# Patient Record
Sex: Male | Born: 1995 | Race: White | Hispanic: No | Marital: Single | Smoking: Never smoker
Health system: Southern US, Community
[De-identification: ages and names within clinical notes are randomized; demographics above are authoritative.]

## PROBLEM LIST (undated history)

## (undated) DIAGNOSIS — F32A Depression, unspecified: Secondary | ICD-10-CM

## (undated) DIAGNOSIS — J45909 Unspecified asthma, uncomplicated: Secondary | ICD-10-CM

## (undated) DIAGNOSIS — F329 Major depressive disorder, single episode, unspecified: Secondary | ICD-10-CM

## (undated) DIAGNOSIS — R569 Unspecified convulsions: Secondary | ICD-10-CM

---
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
## 2023-05-24 DIAGNOSIS — Z419 Encounter for procedure for purposes other than remedying health state, unspecified: Secondary | ICD-10-CM | POA: Diagnosis not present

## 2023-06-08 DIAGNOSIS — F331 Major depressive disorder, recurrent, moderate: Secondary | ICD-10-CM | POA: Diagnosis not present

## 2023-06-08 DIAGNOSIS — F411 Generalized anxiety disorder: Secondary | ICD-10-CM | POA: Diagnosis not present

## 2023-06-08 DIAGNOSIS — F900 Attention-deficit hyperactivity disorder, predominantly inattentive type: Secondary | ICD-10-CM | POA: Diagnosis not present

## 2023-06-15 DIAGNOSIS — F900 Attention-deficit hyperactivity disorder, predominantly inattentive type: Secondary | ICD-10-CM | POA: Diagnosis not present

## 2023-06-22 DIAGNOSIS — F900 Attention-deficit hyperactivity disorder, predominantly inattentive type: Secondary | ICD-10-CM | POA: Diagnosis not present

## 2023-06-22 DIAGNOSIS — F331 Major depressive disorder, recurrent, moderate: Secondary | ICD-10-CM | POA: Diagnosis not present

## 2023-06-22 DIAGNOSIS — F411 Generalized anxiety disorder: Secondary | ICD-10-CM | POA: Diagnosis not present

## 2023-06-23 DIAGNOSIS — Z419 Encounter for procedure for purposes other than remedying health state, unspecified: Secondary | ICD-10-CM | POA: Diagnosis not present

## 2023-07-20 DIAGNOSIS — F900 Attention-deficit hyperactivity disorder, predominantly inattentive type: Secondary | ICD-10-CM | POA: Diagnosis not present

## 2023-07-20 DIAGNOSIS — F331 Major depressive disorder, recurrent, moderate: Secondary | ICD-10-CM | POA: Diagnosis not present

## 2023-07-20 DIAGNOSIS — F411 Generalized anxiety disorder: Secondary | ICD-10-CM | POA: Diagnosis not present

## 2023-07-24 DIAGNOSIS — Z419 Encounter for procedure for purposes other than remedying health state, unspecified: Secondary | ICD-10-CM | POA: Diagnosis not present

## 2023-08-21 DIAGNOSIS — F331 Major depressive disorder, recurrent, moderate: Secondary | ICD-10-CM | POA: Diagnosis not present

## 2023-08-21 DIAGNOSIS — F411 Generalized anxiety disorder: Secondary | ICD-10-CM | POA: Diagnosis not present

## 2023-08-21 DIAGNOSIS — F900 Attention-deficit hyperactivity disorder, predominantly inattentive type: Secondary | ICD-10-CM | POA: Diagnosis not present

## 2023-08-23 DIAGNOSIS — Z419 Encounter for procedure for purposes other than remedying health state, unspecified: Secondary | ICD-10-CM | POA: Diagnosis not present

## 2023-08-25 DIAGNOSIS — F331 Major depressive disorder, recurrent, moderate: Secondary | ICD-10-CM | POA: Diagnosis not present

## 2023-08-25 DIAGNOSIS — F411 Generalized anxiety disorder: Secondary | ICD-10-CM | POA: Diagnosis not present

## 2023-08-25 DIAGNOSIS — F900 Attention-deficit hyperactivity disorder, predominantly inattentive type: Secondary | ICD-10-CM | POA: Diagnosis not present

## 2023-09-23 DIAGNOSIS — Z419 Encounter for procedure for purposes other than remedying health state, unspecified: Secondary | ICD-10-CM | POA: Diagnosis not present

## 2023-10-24 DIAGNOSIS — Z419 Encounter for procedure for purposes other than remedying health state, unspecified: Secondary | ICD-10-CM | POA: Diagnosis not present

## 2023-11-21 DIAGNOSIS — Z419 Encounter for procedure for purposes other than remedying health state, unspecified: Secondary | ICD-10-CM | POA: Diagnosis not present

## 2024-01-02 DIAGNOSIS — Z419 Encounter for procedure for purposes other than remedying health state, unspecified: Secondary | ICD-10-CM | POA: Diagnosis not present

## 2024-02-01 DIAGNOSIS — Z419 Encounter for procedure for purposes other than remedying health state, unspecified: Secondary | ICD-10-CM | POA: Diagnosis not present

## 2024-02-21 ENCOUNTER — Emergency Department (HOSPITAL_BASED_OUTPATIENT_CLINIC_OR_DEPARTMENT_OTHER)
Admission: EM | Admit: 2024-02-21 | Discharge: 2024-02-21 | Disposition: A | Discharge status: Home / Self Care | Attending: Emergency Medicine | Admitting: Emergency Medicine

## 2024-02-21 ENCOUNTER — Emergency Department (HOSPITAL_BASED_OUTPATIENT_CLINIC_OR_DEPARTMENT_OTHER)

## 2024-02-21 ENCOUNTER — Encounter (HOSPITAL_BASED_OUTPATIENT_CLINIC_OR_DEPARTMENT_OTHER): Payer: Self-pay

## 2024-02-21 ENCOUNTER — Other Ambulatory Visit: Payer: Self-pay

## 2024-02-21 DIAGNOSIS — D72829 Elevated white blood cell count, unspecified: Secondary | ICD-10-CM | POA: Insufficient documentation

## 2024-02-21 DIAGNOSIS — R1031 Right lower quadrant pain: Secondary | ICD-10-CM | POA: Diagnosis not present

## 2024-02-21 LAB — URINALYSIS, MICROSCOPIC (REFLEX)
RBC / HPF: NONE SEEN RBC/hpf (ref 0–5)
Squamous Epithelial / HPF: NONE SEEN /HPF (ref 0–5)
WBC, UA: NONE SEEN WBC/hpf (ref 0–5)

## 2024-02-21 LAB — COMPREHENSIVE METABOLIC PANEL WITH GFR
ALT: 16 U/L (ref 0–44)
AST: 23 U/L (ref 15–41)
Albumin: 4.6 g/dL (ref 3.5–5.0)
Alkaline Phosphatase: 66 U/L (ref 38–126)
Anion gap: 12 (ref 5–15)
BUN: 21 mg/dL — ABNORMAL HIGH (ref 6–20)
CO2: 25 mmol/L (ref 22–32)
Calcium: 9.6 mg/dL (ref 8.9–10.3)
Chloride: 105 mmol/L (ref 98–111)
Creatinine, Ser: 1.13 mg/dL (ref 0.61–1.24)
GFR, Estimated: >60 mL/min (ref >60)
Glucose, Bld: 92 mg/dL (ref 70–99)
Potassium: 4.1 mmol/L (ref 3.5–5.1)
Sodium: 141 mmol/L (ref 135–145)
Total Bilirubin: 1.3 mg/dL — ABNORMAL HIGH (ref 0.0–1.2)
Total Protein: 7.7 g/dL (ref 6.5–8.1)

## 2024-02-21 LAB — URINALYSIS, ROUTINE W REFLEX MICROSCOPIC
Bilirubin Urine: NEGATIVE
Glucose, UA: NEGATIVE mg/dL
Hgb urine dipstick: NEGATIVE
Ketones, ur: NEGATIVE mg/dL
Leukocytes,Ua: NEGATIVE
Nitrite: NEGATIVE
Protein, ur: 30 mg/dL — AB
Specific Gravity, Urine: 1.025 (ref 1.005–1.030)
pH: 6.5 (ref 5.0–8.0)

## 2024-02-21 LAB — CBC WITH DIFFERENTIAL/PLATELET
Abs Immature Granulocytes: 0.05 10*3/uL (ref 0.00–0.07)
Basophils Absolute: 0 10*3/uL (ref 0.0–0.1)
Basophils Relative: 0 %
Eosinophils Absolute: 0.1 10*3/uL (ref 0.0–0.5)
Eosinophils Relative: 1 %
HCT: 47.5 % (ref 39.0–52.0)
Hemoglobin: 16.7 g/dL (ref 13.0–17.0)
Immature Granulocytes: 1 %
Lymphocytes Relative: 13 %
Lymphs Abs: 1.5 10*3/uL (ref 0.7–4.0)
MCH: 29.5 pg (ref 26.0–34.0)
MCHC: 35.2 g/dL (ref 30.0–36.0)
MCV: 83.9 fL (ref 80.0–100.0)
Monocytes Absolute: 0.7 10*3/uL (ref 0.1–1.0)
Monocytes Relative: 6 %
Neutro Abs: 8.8 10*3/uL — ABNORMAL HIGH (ref 1.7–7.7)
Neutrophils Relative %: 79 %
Platelets: 267 10*3/uL (ref 150–400)
RBC: 5.66 MIL/uL (ref 4.22–5.81)
RDW: 11.6 % (ref 11.5–15.5)
WBC: 11.1 10*3/uL — ABNORMAL HIGH (ref 4.0–10.5)
nRBC: 0 % (ref 0.0–0.2)

## 2024-02-21 LAB — LIPASE, BLOOD: Lipase: 21 U/L (ref 11–51)

## 2024-02-21 MED ORDER — IOHEXOL 300 MG/ML  SOLN
100.0000 mL | Freq: Once | INTRAMUSCULAR | Status: AC | PRN
  Administered 2024-02-21: 100 mL via INTRAVENOUS

## 2024-02-21 MED ORDER — ONDANSETRON HCL 4 MG/2ML IJ SOLN
4.0000 mg | Freq: Once | INTRAMUSCULAR | Status: AC
  Administered 2024-02-21: 4 mg via INTRAVENOUS
  Filled 2024-02-21: qty 2

## 2024-02-21 MED ORDER — SODIUM CHLORIDE 0.9 % IV BOLUS
1000.0000 mL | Freq: Once | INTRAVENOUS | Status: AC
  Administered 2024-02-21: 1000 mL via INTRAVENOUS

## 2024-02-21 MED ORDER — SODIUM CHLORIDE 0.9 % IV SOLN: INTRAVENOUS | Status: DC | PRN

## 2024-02-21 MED ORDER — MORPHINE SULFATE (PF) 4 MG/ML IV SOLN
4.0000 mg | Freq: Once | INTRAVENOUS | Status: AC
  Administered 2024-02-21: 4 mg via INTRAVENOUS
  Filled 2024-02-21: qty 1

## 2024-02-21 MED ORDER — METRONIDAZOLE 500 MG PO TABS
2000.0000 mg | ORAL_TABLET | Freq: Once | ORAL | Status: AC
  Administered 2024-02-21: 2000 mg via ORAL
  Filled 2024-02-21: qty 4

## 2024-02-21 MED ORDER — SODIUM CHLORIDE 0.9 % IV SOLN
1.0000 g | Freq: Once | INTRAVENOUS | Status: AC
  Administered 2024-02-21: 1 g via INTRAVENOUS
  Filled 2024-02-21: qty 10

## 2024-02-21 MED ORDER — AZITHROMYCIN 250 MG PO TABS
1000.0000 mg | ORAL_TABLET | Freq: Once | ORAL | Status: AC
  Administered 2024-02-21: 1000 mg via ORAL
  Filled 2024-02-21: qty 4

## 2024-02-21 NOTE — ED Notes (Signed)
 ED Provider at bedside.

## 2024-02-21 NOTE — Discharge Instructions (Signed)
 Your CT scan looked okay.  But does not mean that nothing is wrong with you.  Please return for worsening pain fever or inability to eat or drink.  Your urine had bacteria in it.  This could be due to lab error but I did send it off to test for sexually transmitted diseases.  I also treated you with antibiotics here for the same.  If you do come back positive you should let your partners know up to 90 days before today so they can be tested and treated.  Take 4 over the counter ibuprofen tablets 3 times a day or 2 over-the-counter naproxen tablets twice a day for pain. Also take tylenol  1000mg (2 extra strength) four times a day.

## 2024-02-21 NOTE — ED Notes (Signed)
 Patient transported to CT

## 2024-02-21 NOTE — ED Provider Notes (Signed)
 [facility] Provider Note   CSN: [id number] Arrival date & time: 02/21/24  0700     History  Chief Complaint  Patient presents with   Abdominal Pain    Eric Ritter is a 28 y.o. male.  28 yo M with a chief complaints of right lower quadrant abdominal discomfort.  Going on for about 3 days now.  First couple days had nausea and vomiting but this seems to have resolved with the pain has persisted.  No fevers no urinary symptoms.  Sometimes does feel he radiates to his testicles.  Is a bit worse with ambulation.   Abdominal Pain      Home Medications Prior to Admission medications   Medication Sig Start Date End Date Taking? Authorizing Provider  ARIPiprazole  (ABILIFY ) 10 MG tablet Take one-half tablet (total 5 mg) every morning and 1 tablet (total 10 mg) every bedtime 09/19/14   Elesa Grills, MD  cholecalciferol  (VITAMIN D ) 1000 UNITS tablet Take 1 tablet (1,000 Units total) by mouth daily. 09/19/14   Elesa Grills, MD  citalopram  (CELEXA ) 10 MG tablet Take 3 tablets (30 mg total) by mouth at bedtime. 09/19/14   Elesa Grills, MD  clindamycin  (CLINDAGEL ) 1 % gel Apply topically 2 (two) times daily. 09/19/14   Elesa Grills, MD  methylphenidate  36 MG PO CR tablet Take 1 tablet (36 mg total) by mouth daily. 09/19/14   Elesa Grills, MD  traZODone  (DESYREL ) 50 MG tablet Take 1 tablet (50 mg total) by mouth at bedtime. 09/19/14   Elesa Grills, MD      Allergies    Patient has no known allergies.    Review of Systems   Review of Systems  Gastrointestinal:  Positive for abdominal pain.    Physical Exam Updated Vital Signs BP (!) 136/95   Pulse 95   Temp 98.5 F (36.9 C) (Oral)   Resp 20   Ht 5\' 9"  (1.753 m)   Wt 81.6 kg   SpO2 100%   BMI 26.58 kg/m  Physical Exam Vitals and nursing note reviewed.  Constitutional:      Appearance: He is well-developed.  HENT:     Head: Normocephalic and  atraumatic.  Eyes:     Pupils: Pupils are equal, round, and reactive to light.  Neck:     Vascular: No JVD.  Cardiovascular:     Rate and Rhythm: Normal rate and regular rhythm.     Heart sounds: No murmur heard.    No friction rub. No gallop.  Pulmonary:     Effort: No respiratory distress.     Breath sounds: No wheezing.  Abdominal:     General: There is no distension.     Tenderness: There is abdominal tenderness in the right lower quadrant. There is no guarding or rebound.     Comments: Patient has most tenderness closer to the ASIS than McBurney's point.  No guarding no rebound.  Negative Rovsing's.  Genitourinary:    Comments: Cremasteric reflex intact bilaterally.  No obvious testicular tenderness.  No hernias or masses no lymphadenopathy Musculoskeletal:        General: Normal range of motion.     Cervical back: Normal range of motion and neck supple.  Skin:    Coloration: Skin is not pale.     Findings: No rash.  Neurological:     Mental Status: He is alert and oriented to person, place, and time.  Psychiatric:  Behavior: Behavior normal.     ED Results / Procedures / Treatments   Labs (all labs ordered are listed, but only abnormal results are displayed) Labs Reviewed  CBC WITH DIFFERENTIAL/PLATELET - Abnormal; Notable for the following components:      Result Value   WBC 11.1 (*)    Neutro Abs 8.8 (*)    All other components within normal limits  COMPREHENSIVE METABOLIC PANEL WITH GFR - Abnormal; Notable for the following components:   BUN 21 (*)    Total Bilirubin 1.3 (*)    All other components within normal limits  URINALYSIS, ROUTINE W REFLEX MICROSCOPIC - Abnormal; Notable for the following components:   Protein, ur 30 (*)    All other components within normal limits  URINALYSIS, MICROSCOPIC (REFLEX) - Abnormal; Notable for the following components:   Bacteria, UA MANY (*)    All other components within normal limits  LIPASE, BLOOD   GC/CHLAMYDIA PROBE AMP ([place]) NOT AT [facility]    EKG None  Radiology CT ABDOMEN PELVIS W CONTRAST Result Date: 02/21/2024 CLINICAL DATA:  Right lower quadrant abdominal pain. EXAM: CT ABDOMEN AND PELVIS WITH CONTRAST TECHNIQUE: Multidetector CT imaging of the abdomen and pelvis was performed using the standard protocol following bolus administration of intravenous contrast. RADIATION DOSE REDUCTION: This exam was performed according to the departmental dose-optimization program which includes automated exposure control, adjustment of the mA and/or kV according to patient size and/or use of iterative reconstruction technique. CONTRAST:  100mL OMNIPAQUE IOHEXOL 300 MG/ML  SOLN COMPARISON:  None Available. FINDINGS: Lower chest: No acute findings. Hepatobiliary: No suspicious focal abnormality within the liver parenchyma. There is no evidence for gallstones, gallbladder wall thickening, or pericholecystic fluid. No intrahepatic or extrahepatic biliary dilation. Pancreas: No focal mass lesion. No dilatation of the main duct. No intraparenchymal cyst. No peripancreatic edema. Spleen: No splenomegaly. No suspicious focal mass lesion. Adrenals/Urinary Tract: No adrenal nodule or mass. Kidneys unremarkable. No evidence for hydroureter. The urinary bladder appears normal for the degree of distention. Stomach/Bowel: Stomach is unremarkable. No gastric wall thickening. No evidence of outlet obstruction. Duodenum is normally positioned as is the ligament of Treitz. No small bowel wall thickening. No small bowel dilatation. The terminal ileum is normal. High density No gross colonic mass. No colonic wall thickening. material in the lumen of the appendix compatible with the presence of appendicoliths, but appendix otherwise normal in appearance. Vascular/Lymphatic: No abdominal aortic aneurysm. No abdominal aortic atherosclerotic calcification. There is no gastrohepatic or hepatoduodenal ligament lymphadenopathy. No  retroperitoneal or mesenteric lymphadenopathy. No pelvic sidewall lymphadenopathy. Reproductive: The prostate gland and seminal vesicles are unremarkable. Other: No intraperitoneal free fluid. Musculoskeletal: No worrisome lytic or sclerotic osseous abnormality. IMPRESSION: 1. No acute findings in the abdomen or pelvis. Specifically, no findings to explain the patient's history of right lower quadrant pain. 2. Appendicoliths in the lumen of the appendix, but appendix otherwise normal in appearance. Electronically Signed   By: Donnal Fusi M.D.   On: 02/21/2024 09:19    Procedures Procedures    Medications Ordered in ED Medications  0.9 %  sodium chloride infusion (0 mLs Intravenous Stopped 02/21/24 1126)  sodium chloride 0.9 % bolus 1,000 mL (0 mLs Intravenous Stopped 02/21/24 0852)  morphine (PF) 4 MG/ML injection 4 mg (4 mg Intravenous Given 02/21/24 0810)  ondansetron (ZOFRAN) injection 4 mg (4 mg Intravenous Given 02/21/24 0810)  iohexol (OMNIPAQUE) 300 MG/ML solution 100 mL (100 mLs Intravenous Contrast Given 02/21/24 0901)  cefTRIAXone (ROCEPHIN) 1  g in sodium chloride 0.9 % 100 mL IVPB (0 g Intravenous Stopped 02/21/24 1126)  azithromycin (ZITHROMAX) tablet 1,000 mg (1,000 mg Oral Given 02/21/24 1050)  metroNIDAZOLE (FLAGYL) tablet 2,000 mg (2,000 mg Oral Given 02/21/24 1050)    ED Course/ Medical Decision Making/ A&P                                 Medical Decision Making Amount and/or Complexity of Data Reviewed Labs: ordered. Radiology: ordered.  Risk Prescription drug management.   28 yo M with a chief complaints of right lower quadrant abdominal discomfort.  Going on for about 3 days now.  Initially was some nausea and vomiting.  On exam patient's pain seems to be closer to the ASIS than the tenderness to McBurney's point.  Wonder if this is muscular strain that would not be common to cause vomiting with.  Will obtain CT imaging to assess for appendicitis.  CT scan without  appendicitis.  No obvious other intra-abdominal pathology was found.  Mild leukocytosis.  LFTs and lipase unremarkable.  UA with too numerous Bacteria but negative leukocyte esterase and negative nitrites.  I am unsure the significance of this finding.  I did discuss this with the patient.  He did seem concerned that he could have a sexually transmitted disease.  Will treat here.  Send off for gonorrhea chlamydia.  Of note the patient's father approached me outside the room and asked for information without the patient's consent.  I encouraged the patient's father to discuss it with him.  11:44 AM:  I have discussed the diagnosis/risks/treatment options with the patient and family.  Evaluation and diagnostic testing in the emergency department does not suggest an emergent condition requiring admission or immediate intervention beyond what has been performed at this time.  They will follow up with PCP. We also discussed returning to the ED immediately if new or worsening sx occur. We discussed the sx which are most concerning (e.g., sudden worsening pain, fever, inability to tolerate by mouth) that necessitate immediate return. Medications administered to the patient during their visit and any new prescriptions provided to the patient are listed below.  Medications given during this visit Medications  0.9 %  sodium chloride infusion (0 mLs Intravenous Stopped 02/21/24 1126)  sodium chloride 0.9 % bolus 1,000 mL (0 mLs Intravenous Stopped 02/21/24 0852)  morphine (PF) 4 MG/ML injection 4 mg (4 mg Intravenous Given 02/21/24 0810)  ondansetron (ZOFRAN) injection 4 mg (4 mg Intravenous Given 02/21/24 0810)  iohexol (OMNIPAQUE) 300 MG/ML solution 100 mL (100 mLs Intravenous Contrast Given 02/21/24 0901)  cefTRIAXone (ROCEPHIN) 1 g in sodium chloride 0.9 % 100 mL IVPB (0 g Intravenous Stopped 02/21/24 1126)  azithromycin (ZITHROMAX) tablet 1,000 mg (1,000 mg Oral Given 02/21/24 1050)  metroNIDAZOLE (FLAGYL) tablet  2,000 mg (2,000 mg Oral Given 02/21/24 1050)     The patient appears reasonably screen and/or stabilized for discharge and I doubt any other medical condition or other [facility] requiring further screening, evaluation, or treatment in the ED at this time prior to discharge.         Final Clinical Impression(s) / ED Diagnoses Final diagnoses:  RLQ abdominal pain    Rx / DC Orders ED Discharge Orders     None         Albertus Hughs, DO 02/21/24 1145

## 2024-02-22 ENCOUNTER — Other Ambulatory Visit: Payer: Self-pay

## 2024-02-22 ENCOUNTER — Encounter (HOSPITAL_BASED_OUTPATIENT_CLINIC_OR_DEPARTMENT_OTHER): Payer: Self-pay

## 2024-02-22 ENCOUNTER — Emergency Department (HOSPITAL_BASED_OUTPATIENT_CLINIC_OR_DEPARTMENT_OTHER)
Admission: EM | Admit: 2024-02-22 | Discharge: 2024-02-22 | Disposition: A | Discharge status: Home / Self Care | Attending: Emergency Medicine | Admitting: Emergency Medicine

## 2024-02-22 DIAGNOSIS — R319 Hematuria, unspecified: Secondary | ICD-10-CM

## 2024-02-22 DIAGNOSIS — N390 Urinary tract infection, site not specified: Secondary | ICD-10-CM | POA: Insufficient documentation

## 2024-02-22 LAB — GC/CHLAMYDIA PROBE AMP (~~LOC~~) NOT AT ARMC
Chlamydia: NEGATIVE
Comment: NEGATIVE
Comment: NORMAL
Neisseria Gonorrhea: NEGATIVE

## 2024-02-22 LAB — URINALYSIS, ROUTINE W REFLEX MICROSCOPIC
Bilirubin Urine: NEGATIVE
Glucose, UA: NEGATIVE mg/dL
Hgb urine dipstick: NEGATIVE
Ketones, ur: 15 mg/dL — AB
Nitrite: NEGATIVE
Protein, ur: NEGATIVE mg/dL
Specific Gravity, Urine: 1.02 (ref 1.005–1.030)
pH: 5.5 (ref 5.0–8.0)

## 2024-02-22 LAB — URINALYSIS, MICROSCOPIC (REFLEX): RBC / HPF: NONE SEEN RBC/hpf (ref 0–5)

## 2024-02-22 MED ORDER — CEPHALEXIN 500 MG PO CAPS: 500.0000 mg | ORAL_CAPSULE | Freq: Two times a day (BID) | ORAL | 0 refills | Status: AC

## 2024-02-22 NOTE — ED Triage Notes (Signed)
 Pt seen yesterday for abdominal pain. Woke up this morning with blood in urine. Denies N//V/D

## 2024-02-22 NOTE — ED Provider Notes (Signed)
 [place] [facility] Provider Note   CSN: [id number] Arrival date & time: 02/22/24  3762     History  Chief Complaint  Patient presents with   Hematuria    Eric Ritter is a 28 y.o. male. Patient with MDD, ADHD presenting with complaint of hematuria. He has one episode this morning, describes as bright red. Notes he has had some dysuria associated with this. Is not on BT, no injury trauma or fall recently. Was seen in ER yesterday for RLQ abd pain. Patient reports taking ibuprofen and having mild improvement of symptoms. He is tolerating oral intake, no more NVD. Reports he is not sexually active. Reports recent travel to [place], returned 4 days ago, reports pain started after walking around a lot, but he is doing better.     Hematuria       Home Medications Prior to Admission medications   Medication Sig Start Date End Date Taking? Authorizing Provider  ARIPiprazole  (ABILIFY ) 10 MG tablet Take one-half tablet (total 5 mg) every morning and 1 tablet (total 10 mg) every bedtime 09/19/14   Elesa Grills, MD  cholecalciferol  (VITAMIN D ) 1000 UNITS tablet Take 1 tablet (1,000 Units total) by mouth daily. 09/19/14   Elesa Grills, MD  citalopram  (CELEXA ) 10 MG tablet Take 3 tablets (30 mg total) by mouth at bedtime. 09/19/14   Elesa Grills, MD  clindamycin  (CLINDAGEL ) 1 % gel Apply topically 2 (two) times daily. 09/19/14   Elesa Grills, MD  methylphenidate  36 MG PO CR tablet Take 1 tablet (36 mg total) by mouth daily. 09/19/14   Elesa Grills, MD  traZODone  (DESYREL ) 50 MG tablet Take 1 tablet (50 mg total) by mouth at bedtime. 09/19/14   Elesa Grills, MD      Allergies    Patient has no known allergies.    Review of Systems   Review of Systems  Genitourinary:  Positive for hematuria.    Physical Exam Updated Vital Signs BP 132/79   Pulse 94   Temp 98 F (36.7 C)   Resp 16   SpO2 96%  Physical Exam Vitals  and nursing note reviewed.  Constitutional:      General: He is not in acute distress.    Appearance: He is not toxic-appearing.  HENT:     Head: Normocephalic and atraumatic.  Eyes:     General: No scleral icterus.    Conjunctiva/sclera: Conjunctivae normal.  Cardiovascular:     Rate and Rhythm: Normal rate and regular rhythm.     Pulses: Normal pulses.     Heart sounds: Normal heart sounds.  Pulmonary:     Effort: Pulmonary effort is normal. No respiratory distress.     Breath sounds: Normal breath sounds.  Abdominal:     General: Abdomen is flat. Bowel sounds are normal.     Palpations: Abdomen is soft.     Tenderness: There is no abdominal tenderness.     Comments: Negative McBurney's, negative Rovsing's.   Skin:    General: Skin is warm and dry.     Findings: No lesion.  Neurological:     General: No focal deficit present.     Mental Status: He is alert and oriented to person, place, and time. Mental status is at baseline.     ED Results / Procedures / Treatments   Labs (all labs ordered are listed, but only abnormal results are displayed) Labs Reviewed  URINALYSIS, ROUTINE W REFLEX  MICROSCOPIC - Abnormal; Notable for the following components:      Result Value   Ketones, ur 15 (*)    Leukocytes,Ua TRACE (*)    All other components within normal limits  URINALYSIS, MICROSCOPIC (REFLEX) - Abnormal; Notable for the following components:   Bacteria, UA RARE (*)    All other components within normal limits  URINE CULTURE    EKG None  Radiology   Procedures Procedures    Medications Ordered in ED Medications - No data to display  ED Course/ Medical Decision Making/ A&P Clinical Course as of 02/22/24 0940  Mon Feb 22, 2024  0934 Declined lab work.  [JB]    Clinical Course User Index [JB] Eric Ritter, Kandace Organ, PA-C                                 Medical Decision Making Amount and/or Complexity of Data Reviewed Labs: ordered.  Risk Prescription drug  management.   This patient presents to the ED for concern of hematuria, this involves an extensive number of treatment options, and is a complaint that carries with it a high risk of complications and morbidity.  The differential diagnosis includes urinary tract infection, STD, glomerulonephritis, kidney stone, appendicitis, pancreatitis   Additional history obtained:  Additional history obtained from reviewed ED visit from yesterday in which patient was suspected to have muscular injury of right hip causing his pain.  He had bacteria in urine and thus was treated with 1 g azithromycin, 1 g Rocephin and 2 g Flagyl for.  STD treatment.   Lab Tests:  I personally interpreted labs.  The pertinent results include:   UA shows trace leukocytosis, rare bacteria --he was empirically treated for STDs yesterday.  Given leukocytes and bacteria will treat for UTI.  Offered to recheck kidney function and hgb given change in symptoms since last seen, patient declined.    Imaging Studies ordered:  Considered, but patient had imaging less than 24 hours ago, his abdominal pain has improved since then thus do not think needs repeated.    Cardiac Monitoring: / EKG:  The patient was maintained on a cardiac monitor.    Problem List / ED Course / Critical interventions / Medication management  Patient reporting to emergency room with complaint of hematuria.  This happened 1 time.  He was seen here yesterday for abdominal pain and had reassuring lab work, normal kidney function and normal CT scan.  Today he is hemodynamically stable and well-appearing.  He has negative Rovsing's and negative McBurney's thus I do not feel CT scan is needed to be repeated today.  He does note some dysuria.  There is no CVA tenderness.  His UA shows rare bacteria and trace leukocytes.  He was treated yesterday for STDs empirically. Declined repeat CBC, BMP. Given new leukocytes and dysuria will treat with antibiotic for urinary  tract infection.  Given that pain in right leg and hip is worse with movement and tender to palpation over anterior iliac spine feel most likely muscular in nature.  Feel ibuprofen, gentle stretching ice and heat are appropriate for symptom control.  He will follow-up with primary care for recheck in 3 days.  Declined medication I have reviewed the patients home medicines and have made adjustments as needed   Plan  F/u w/ PCP in 2-3d to ensure resolution of sx.  Patient was given return precautions. Patient stable for discharge at this  time.  Patient educated on sx/dx and verbalized understanding of plan. Return to ER w/ new or worsening sx.          Final Clinical Impression(s) / ED Diagnoses Final diagnoses:  Hematuria, unspecified type  Lower urinary tract infectious disease    Rx / DC Orders ED Discharge Orders          Ordered    cephALEXin (KEFLEX) 500 MG capsule  2 times daily        02/22/24 1026              Rahma Meller, Kandace Organ, PA-C 02/22/24 1030    Mozell Arias, MD 02/22/24 1420

## 2024-02-22 NOTE — Discharge Instructions (Signed)
 You were seen in emergency room today for blood in your urine.  Your urinalysis today does not show blood in urine.  It does show small amount of bacteria.  I will start you on an antibiotic called Keflex.  Please follow-up with primary care for recheck on symptoms.

## 2024-02-23 LAB — URINE CULTURE: Culture: NO GROWTH

## 2024-03-02 DIAGNOSIS — N50811 Right testicular pain: Secondary | ICD-10-CM | POA: Diagnosis not present

## 2024-03-03 DIAGNOSIS — Z419 Encounter for procedure for purposes other than remedying health state, unspecified: Secondary | ICD-10-CM | POA: Diagnosis not present

## 2024-03-23 DIAGNOSIS — N50811 Right testicular pain: Secondary | ICD-10-CM | POA: Diagnosis not present
